# Patient Record
Sex: Male | Born: 1985 | Race: Black or African American | Hispanic: No | Marital: Single | Smoking: Never smoker
Health system: Southern US, Community
[De-identification: ages and names within clinical notes are randomized; demographics above are authoritative.]

## PROBLEM LIST (undated history)

## (undated) DIAGNOSIS — S42009A Fracture of unspecified part of unspecified clavicle, initial encounter for closed fracture: Secondary | ICD-10-CM

---
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
## 2017-06-02 ENCOUNTER — Encounter (HOSPITAL_COMMUNITY): Payer: Self-pay | Admitting: Nurse Practitioner

## 2017-06-02 ENCOUNTER — Other Ambulatory Visit: Payer: Self-pay

## 2017-06-02 ENCOUNTER — Emergency Department (HOSPITAL_COMMUNITY)
Admission: EM | Admit: 2017-06-02 | Discharge: 2017-06-02 | Disposition: A | Discharge status: Home / Self Care | Payer: BLUE CROSS/BLUE SHIELD | Attending: Emergency Medicine | Admitting: Emergency Medicine

## 2017-06-02 ENCOUNTER — Emergency Department (HOSPITAL_COMMUNITY): Payer: BLUE CROSS/BLUE SHIELD

## 2017-06-02 DIAGNOSIS — R600 Localized edema: Secondary | ICD-10-CM | POA: Insufficient documentation

## 2017-06-02 DIAGNOSIS — L03115 Cellulitis of right lower limb: Secondary | ICD-10-CM | POA: Insufficient documentation

## 2017-06-02 DIAGNOSIS — M25561 Pain in right knee: Secondary | ICD-10-CM | POA: Diagnosis present

## 2017-06-02 LAB — CBC WITH DIFFERENTIAL/PLATELET
Basophils Absolute: 0 10*3/uL (ref 0.0–0.1)
Basophils Relative: 0 %
EOS ABS: 0.2 10*3/uL (ref 0.0–0.7)
EOS PCT: 2 %
HCT: 42.8 % (ref 39.0–52.0)
HEMOGLOBIN: 15.1 g/dL (ref 13.0–17.0)
LYMPHS ABS: 3.1 10*3/uL (ref 0.7–4.0)
LYMPHS PCT: 26 %
MCH: 29.4 pg (ref 26.0–34.0)
MCHC: 35.3 g/dL (ref 30.0–36.0)
MCV: 83.4 fL (ref 78.0–100.0)
MONOS PCT: 11 %
Monocytes Absolute: 1.3 10*3/uL — ABNORMAL HIGH (ref 0.1–1.0)
Neutro Abs: 7.1 10*3/uL (ref 1.7–7.7)
Neutrophils Relative %: 61 %
PLATELETS: 171 10*3/uL (ref 150–400)
RBC: 5.13 MIL/uL (ref 4.22–5.81)
RDW: 12.2 % (ref 11.5–15.5)
WBC: 11.7 10*3/uL — AB (ref 4.0–10.5)

## 2017-06-02 LAB — COMPREHENSIVE METABOLIC PANEL
ALBUMIN: 4.5 g/dL (ref 3.5–5.0)
ALT: 64 U/L — AB (ref 17–63)
AST: 52 U/L — AB (ref 15–41)
Alkaline Phosphatase: 73 U/L (ref 38–126)
Anion gap: 8 (ref 5–15)
BUN: 11 mg/dL (ref 6–20)
CHLORIDE: 103 mmol/L (ref 101–111)
CO2: 28 mmol/L (ref 22–32)
CREATININE: 1.07 mg/dL (ref 0.61–1.24)
Calcium: 9.3 mg/dL (ref 8.9–10.3)
GFR calc Af Amer: >60 mL/min (ref >60)
GLUCOSE: 95 mg/dL (ref 65–99)
POTASSIUM: 4 mmol/L (ref 3.5–5.1)
SODIUM: 139 mmol/L (ref 135–145)
Total Bilirubin: 1.7 mg/dL — ABNORMAL HIGH (ref 0.3–1.2)
Total Protein: 8.1 g/dL (ref 6.5–8.1)

## 2017-06-02 LAB — I-STAT CG4 LACTIC ACID, ED: LACTIC ACID, VENOUS: 0.99 mmol/L (ref 0.5–1.9)

## 2017-06-02 MED ORDER — CLINDAMYCIN HCL 300 MG PO CAPS: 300.0000 mg | ORAL_CAPSULE | Freq: Three times a day (TID) | ORAL | 0 refills | Status: AC

## 2017-06-02 MED ORDER — OXYCODONE-ACETAMINOPHEN 5-325 MG PO TABS
1.0000 | ORAL_TABLET | Freq: Once | ORAL | Status: AC
  Administered 2017-06-02: 1 via ORAL
  Filled 2017-06-02: qty 1

## 2017-06-02 MED ORDER — CLINDAMYCIN HCL 300 MG PO CAPS
300.0000 mg | ORAL_CAPSULE | Freq: Once | ORAL | Status: AC
  Administered 2017-06-02: 300 mg via ORAL
  Filled 2017-06-02: qty 1

## 2017-06-02 MED ORDER — DICLOFENAC SODIUM 50 MG PO TBEC: 50.0000 mg | DELAYED_RELEASE_TABLET | Freq: Two times a day (BID) | ORAL | 0 refills | Status: AC

## 2017-06-02 NOTE — Discharge Instructions (Signed)
Take the medication as directed. Follow up with your doctor on Monday or return here for worsening symptoms.

## 2017-06-02 NOTE — ED Provider Notes (Signed)
[facility]-EMERGENCY DEPT Provider Note   CSN: [id number] Arrival date & time: 06/02/17  1503 [phone]    History   Chief Complaint Chief Complaint  Patient presents with  . Joint Swelling    R knee    HPI Allen Morrison is a 31 y.o. male who presents to the ED with right knee pain. Patient reports that 3 days ago he had a small pimple like area on his knee and he popped it. After that his knee began to swell and get red and feel hot. Patient went to Urgent Care and was sent here for evaluation for possible septic joint.   HPI  History reviewed. No pertinent past medical history.  There are no active problems to display for this patient.   History reviewed. No pertinent surgical history.     Home Medications    Prior to Admission medications   Medication Sig Start Date End Date Taking? Authorizing Provider  clindamycin (CLEOCIN) 300 MG capsule Take 1 capsule (300 mg total) by mouth 3 (three) times daily. 06/02/17   Janne NapoleonNeese, Laniqua Torrens M, NP  diclofenac (VOLTAREN) 50 MG EC tablet Take 1 tablet (50 mg total) by mouth 2 (two) times daily. 06/02/17   Janne NapoleonNeese, Lanique Gonzalo M, NP    Family History History reviewed. No pertinent family history.  Social History Social History   Tobacco Use  . Smoking status: Not on file  Substance Use Topics  . Alcohol use: Not on file  . Drug use: Not on file     Allergies   Patient has no known allergies.   Review of Systems Review of Systems  Constitutional: Positive for chills. Fever: ?  HENT: Positive for congestion and sinus pressure.   Respiratory: Negative for shortness of breath.   Cardiovascular: Leg swelling: right knee.  Gastrointestinal: Negative for nausea and vomiting.  Musculoskeletal: Positive for arthralgias.  Skin: Positive for wound.  Allergic/Immunologic: Negative for immunocompromised state.  Psychiatric/Behavioral: Negative for confusion.     Physical Exam Updated Vital Signs BP 137/90 (BP Location:  Right Arm)   Pulse 81   Temp 97.6 F (36.4 C) (Oral)   Resp 14   SpO2 99%   Physical Exam  Constitutional: He is oriented to person, place, and time. He appears well-developed and well-nourished. No distress.  HENT:  Head: Normocephalic.  Eyes: EOM are normal.  Neck: Neck supple.  Cardiovascular: Normal rate.  Pulmonary/Chest: Effort normal.  Musculoskeletal: Normal range of motion.       Right knee: He exhibits swelling and erythema. He exhibits normal range of motion, no effusion and no ecchymosis. Tenderness found.       Legs: Right knee with full range of motion with mild pain. There is swelling and erythema noted to the anterior aspect of the right knee. There is a small wound noted. No gross knee effusion. Pedal pulse 2+, adequate circulation.   Neurological: He is alert and oriented to person, place, and time. No cranial nerve deficit.  Skin: There is erythema.  Psychiatric: He has a normal mood and affect. His behavior is normal.  Nursing note and vitals reviewed.    ED Treatments / Results  Labs (all labs ordered are listed, but only abnormal results are displayed) Labs Reviewed  COMPREHENSIVE METABOLIC PANEL - Abnormal; Notable for the following components:      Result Value   AST 52 (*)    ALT 64 (*)    Total Bilirubin 1.7 (*)    All other components  within normal limits  CBC WITH DIFFERENTIAL/PLATELET - Abnormal; Notable for the following components:   WBC 11.7 (*)    Monocytes Absolute 1.3 (*)    All other components within normal limits  I-STAT CG4 LACTIC ACID, ED     Radiology Dg Knee Complete 4 Views Right  Result Date: 06/02/2017 CLINICAL DATA:  Right knee redness and swelling. EXAM: RIGHT KNEE - COMPLETE 4+ VIEW COMPARISON:  None. FINDINGS: No evidence of fracture, dislocation, or joint effusion. No evidence of arthropathy or other focal bone abnormality. Prominent prepatellar and infrapatellar soft tissue swelling. IMPRESSION: Prominent prepatellar  and infrapatellar soft tissue swelling. No acute osseous abnormality or knee joint effusion. Electronically Signed   By: Obie DredgeWilliam T Derry M.D.   On: 06/02/2017 16:24    Procedures Procedures (including critical care time)  Medications Ordered in ED Medications  oxyCODONE-acetaminophen (PERCOCET/ROXICET) 5-325 MG per tablet 1 tablet (1 tablet Oral Given 06/02/17 1933)  clindamycin (CLEOCIN) capsule 300 mg (300 mg Oral Given 06/02/17 1933)   Dr. Rush Landmarkegeler in to evaluate the patient and agrees with assess and plan.  Initial Impression / Assessment and Plan / ED Course  I have reviewed the triage vital signs and the nursing notes. 31 y.o. male with right knee pain and swelling stable for d/c without red streaking, fever or difficulty ambulation. Discussed in detail with the patient plan of care including antibiotics and close follow up. Return precautions discussed. Patient agrees with plan.  Final Clinical Impressions(s) / ED Diagnoses   Final diagnoses:  Cellulitis of right knee    ED Discharge Orders        Ordered    clindamycin (CLEOCIN) 300 MG capsule  3 times daily     06/02/17 1920    diclofenac (VOLTAREN) 50 MG EC tablet  2 times daily     06/02/17 [address]       Daiel Strohecker ArnoldM, TexasNP 06/02/17 1941    Tegeler, Canary Brimhristopher J, MD 06/02/17 [phone]

## 2017-06-02 NOTE — ED Triage Notes (Addendum)
Pt reports R knee swelling x3 days. Denies fevers, but R knee is hot to touch. He denies any injury or insect bites. He reports that he had a bump that he thought was a pimple so he tried to pop it and then next day it was sore and starting swelling. Sent from Urgent Care with concern for a septic joint.

## 2018-12-10 ENCOUNTER — Encounter (HOSPITAL_COMMUNITY): Payer: Self-pay | Admitting: Pharmacy Technician

## 2018-12-10 ENCOUNTER — Emergency Department (HOSPITAL_COMMUNITY): Payer: BC Managed Care – PPO

## 2018-12-10 ENCOUNTER — Other Ambulatory Visit: Payer: Self-pay

## 2018-12-10 ENCOUNTER — Inpatient Hospital Stay (HOSPITAL_COMMUNITY)
Admission: EM | Admit: 2018-12-10 | Discharge: 2018-12-14 | DRG: 957 | Disposition: A | Discharge status: Home Health | Payer: BC Managed Care – PPO | Attending: Physician Assistant | Admitting: Physician Assistant

## 2018-12-10 DIAGNOSIS — S06339A Contusion and laceration of cerebrum, unspecified, with loss of consciousness of unspecified duration, initial encounter: Secondary | ICD-10-CM | POA: Diagnosis present

## 2018-12-10 DIAGNOSIS — S42021A Displaced fracture of shaft of right clavicle, initial encounter for closed fracture: Secondary | ICD-10-CM

## 2018-12-10 DIAGNOSIS — Z23 Encounter for immunization: Secondary | ICD-10-CM

## 2018-12-10 DIAGNOSIS — T1490XA Injury, unspecified, initial encounter: Secondary | ICD-10-CM | POA: Diagnosis not present

## 2018-12-10 DIAGNOSIS — S02119A Unspecified fracture of occiput, initial encounter for closed fracture: Secondary | ICD-10-CM

## 2018-12-10 DIAGNOSIS — S27329A Contusion of lung, unspecified, initial encounter: Secondary | ICD-10-CM

## 2018-12-10 DIAGNOSIS — Z885 Allergy status to narcotic agent status: Secondary | ICD-10-CM

## 2018-12-10 DIAGNOSIS — J939 Pneumothorax, unspecified: Secondary | ICD-10-CM

## 2018-12-10 DIAGNOSIS — S2241XA Multiple fractures of ribs, right side, initial encounter for closed fracture: Secondary | ICD-10-CM

## 2018-12-10 DIAGNOSIS — T148XXA Other injury of unspecified body region, initial encounter: Secondary | ICD-10-CM

## 2018-12-10 DIAGNOSIS — S27321A Contusion of lung, unilateral, initial encounter: Secondary | ICD-10-CM

## 2018-12-10 DIAGNOSIS — S42111A Displaced fracture of body of scapula, right shoulder, initial encounter for closed fracture: Secondary | ICD-10-CM | POA: Diagnosis present

## 2018-12-10 DIAGNOSIS — S42001A Fracture of unspecified part of right clavicle, initial encounter for closed fracture: Principal | ICD-10-CM

## 2018-12-10 DIAGNOSIS — D62 Acute posthemorrhagic anemia: Secondary | ICD-10-CM | POA: Diagnosis present

## 2018-12-10 DIAGNOSIS — N179 Acute kidney failure, unspecified: Secondary | ICD-10-CM | POA: Diagnosis present

## 2018-12-10 DIAGNOSIS — Z419 Encounter for procedure for purposes other than remedying health state, unspecified: Secondary | ICD-10-CM

## 2018-12-10 DIAGNOSIS — Z1159 Encounter for screening for other viral diseases: Secondary | ICD-10-CM

## 2018-12-10 DIAGNOSIS — K661 Hemoperitoneum: Secondary | ICD-10-CM | POA: Diagnosis present

## 2018-12-10 DIAGNOSIS — S42191A Fracture of other part of scapula, right shoulder, initial encounter for closed fracture: Secondary | ICD-10-CM

## 2018-12-10 DIAGNOSIS — S2249XA Multiple fractures of ribs, unspecified side, initial encounter for closed fracture: Secondary | ICD-10-CM

## 2018-12-10 DIAGNOSIS — S0101XA Laceration without foreign body of scalp, initial encounter: Secondary | ICD-10-CM

## 2018-12-10 DIAGNOSIS — S272XXA Traumatic hemopneumothorax, initial encounter: Secondary | ICD-10-CM

## 2018-12-10 DIAGNOSIS — S42009A Fracture of unspecified part of unspecified clavicle, initial encounter for closed fracture: Secondary | ICD-10-CM

## 2018-12-10 DIAGNOSIS — R000 Tachycardia, unspecified: Secondary | ICD-10-CM | POA: Diagnosis present

## 2018-12-10 HISTORY — DX: Fracture of unspecified part of unspecified clavicle, initial encounter for closed fracture: S42.009A

## 2018-12-10 LAB — COMPREHENSIVE METABOLIC PANEL
ALT: 29 U/L (ref 0–44)
AST: 41 U/L (ref 15–41)
Albumin: 4.1 g/dL (ref 3.5–5.0)
Alkaline Phosphatase: 55 U/L (ref 38–126)
Anion gap: 13 (ref 5–15)
BUN: 12 mg/dL (ref 6–20)
CO2: 21 mmol/L — ABNORMAL LOW (ref 22–32)
Calcium: 9.1 mg/dL (ref 8.9–10.3)
Chloride: 105 mmol/L (ref 98–111)
Creatinine, Ser: 1.29 mg/dL — ABNORMAL HIGH (ref 0.61–1.24)
GFR calc Af Amer: >60 mL/min (ref >60)
GFR calc non Af Amer: >60 mL/min (ref >60)
Glucose, Bld: 144 mg/dL — ABNORMAL HIGH (ref 70–99)
Potassium: 3.4 mmol/L — ABNORMAL LOW (ref 3.5–5.1)
Sodium: 139 mmol/L (ref 135–145)
Total Bilirubin: 1.4 mg/dL — ABNORMAL HIGH (ref 0.3–1.2)
Total Protein: 6.9 g/dL (ref 6.5–8.1)

## 2018-12-10 LAB — CBC
HCT: 45.9 % (ref 39.0–52.0)
Hemoglobin: 15.9 g/dL (ref 13.0–17.0)
MCH: 29.2 pg (ref 26.0–34.0)
MCHC: 34.6 g/dL (ref 30.0–36.0)
MCV: 84.2 fL (ref 80.0–100.0)
Platelets: 184 10*3/uL (ref 150–400)
RBC: 5.45 MIL/uL (ref 4.22–5.81)
RDW: 12 % (ref 11.5–15.5)
WBC: 13.5 10*3/uL — ABNORMAL HIGH (ref 4.0–10.5)
nRBC: 0 % (ref 0.0–0.2)

## 2018-12-10 LAB — PROTIME-INR
INR: 1.1 (ref 0.8–1.2)
Prothrombin Time: 13.8 seconds (ref 11.4–15.2)

## 2018-12-10 LAB — I-STAT CHEM 8, ED
BUN: 14 mg/dL (ref 6–20)
Calcium, Ion: 1.06 mmol/L — ABNORMAL LOW (ref 1.15–1.40)
Chloride: 107 mmol/L (ref 98–111)
Creatinine, Ser: 1.4 mg/dL — ABNORMAL HIGH (ref 0.61–1.24)
Glucose, Bld: 142 mg/dL — ABNORMAL HIGH (ref 70–99)
HCT: 46 % (ref 39.0–52.0)
Hemoglobin: 15.6 g/dL (ref 13.0–17.0)
Potassium: 3.2 mmol/L — ABNORMAL LOW (ref 3.5–5.1)
Sodium: 140 mmol/L (ref 135–145)
TCO2: 24 mmol/L (ref 22–32)

## 2018-12-10 LAB — CDS SEROLOGY

## 2018-12-10 LAB — ETHANOL: Alcohol, Ethyl (B): <10 mg/dL (ref <10)

## 2018-12-10 LAB — LACTIC ACID, PLASMA: Lactic Acid, Venous: 1.9 mmol/L (ref 0.5–1.9)

## 2018-12-10 LAB — SAMPLE TO BLOOD BANK

## 2018-12-10 MED ORDER — TETANUS-DIPHTH-ACELL PERTUSSIS 5-2.5-18.5 LF-MCG/0.5 IM SUSP
0.5000 mL | Freq: Once | INTRAMUSCULAR | Status: AC
  Administered 2018-12-10: 0.5 mL via INTRAMUSCULAR
  Filled 2018-12-10: qty 0.5

## 2018-12-10 MED ORDER — SODIUM CHLORIDE 0.9 % IV BOLUS
500.0000 mL | Freq: Once | INTRAVENOUS | Status: AC
  Administered 2018-12-10: 500 mL via INTRAVENOUS

## 2018-12-10 MED ORDER — IOHEXOL 300 MG/ML  SOLN
100.0000 mL | Freq: Once | INTRAMUSCULAR | Status: AC | PRN
  Administered 2018-12-10: 100 mL via INTRAVENOUS

## 2018-12-10 MED ORDER — FENTANYL CITRATE (PF) 100 MCG/2ML IJ SOLN
50.0000 ug | Freq: Once | INTRAMUSCULAR | Status: AC
  Administered 2018-12-10: 50 ug via INTRAVENOUS
  Filled 2018-12-10: qty 2
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
## 2018-12-10 NOTE — ED Notes (Signed)
Pt transported to CT ?

## 2018-12-11 ENCOUNTER — Emergency Department (HOSPITAL_COMMUNITY): Payer: BC Managed Care – PPO

## 2018-12-11 ENCOUNTER — Encounter (HOSPITAL_COMMUNITY): Payer: Self-pay

## 2018-12-11 ENCOUNTER — Inpatient Hospital Stay (HOSPITAL_COMMUNITY): Payer: BC Managed Care – PPO | Admitting: Anesthesiology

## 2018-12-11 ENCOUNTER — Inpatient Hospital Stay (HOSPITAL_COMMUNITY): Payer: BC Managed Care – PPO

## 2018-12-11 ENCOUNTER — Encounter (HOSPITAL_COMMUNITY): Admission: EM | Disposition: A | Discharge status: Home Health | Payer: Self-pay | Source: Home / Self Care

## 2018-12-11 DIAGNOSIS — S06339A Contusion and laceration of cerebrum, unspecified, with loss of consciousness of unspecified duration, initial encounter: Secondary | ICD-10-CM | POA: Diagnosis present

## 2018-12-11 DIAGNOSIS — N179 Acute kidney failure, unspecified: Secondary | ICD-10-CM | POA: Diagnosis present

## 2018-12-11 DIAGNOSIS — Z23 Encounter for immunization: Secondary | ICD-10-CM | POA: Diagnosis not present

## 2018-12-11 DIAGNOSIS — S42001A Fracture of unspecified part of right clavicle, initial encounter for closed fracture: Secondary | ICD-10-CM | POA: Diagnosis present

## 2018-12-11 DIAGNOSIS — D62 Acute posthemorrhagic anemia: Secondary | ICD-10-CM | POA: Diagnosis present

## 2018-12-11 DIAGNOSIS — S0101XA Laceration without foreign body of scalp, initial encounter: Secondary | ICD-10-CM | POA: Diagnosis present

## 2018-12-11 DIAGNOSIS — S42111A Displaced fracture of body of scapula, right shoulder, initial encounter for closed fracture: Secondary | ICD-10-CM | POA: Diagnosis present

## 2018-12-11 DIAGNOSIS — Z885 Allergy status to narcotic agent status: Secondary | ICD-10-CM | POA: Diagnosis not present

## 2018-12-11 DIAGNOSIS — T1490XA Injury, unspecified, initial encounter: Secondary | ICD-10-CM | POA: Diagnosis present

## 2018-12-11 DIAGNOSIS — Z1159 Encounter for screening for other viral diseases: Secondary | ICD-10-CM | POA: Diagnosis not present

## 2018-12-11 DIAGNOSIS — K661 Hemoperitoneum: Secondary | ICD-10-CM | POA: Diagnosis present

## 2018-12-11 DIAGNOSIS — R000 Tachycardia, unspecified: Secondary | ICD-10-CM | POA: Diagnosis present

## 2018-12-11 DIAGNOSIS — S272XXA Traumatic hemopneumothorax, initial encounter: Secondary | ICD-10-CM | POA: Diagnosis present

## 2018-12-11 DIAGNOSIS — S2241XA Multiple fractures of ribs, right side, initial encounter for closed fracture: Secondary | ICD-10-CM | POA: Diagnosis present

## 2018-12-11 HISTORY — PX: ORIF CLAVICLE FRACTURE: SUR924

## 2018-12-11 HISTORY — PX: ORIF CLAVICULAR FRACTURE: SHX5055

## 2018-12-11 LAB — CBC
HCT: 42.3 % (ref 39.0–52.0)
Hemoglobin: 14.5 g/dL (ref 13.0–17.0)
MCH: 28.7 pg (ref 26.0–34.0)
MCHC: 34.3 g/dL (ref 30.0–36.0)
MCV: 83.8 fL (ref 80.0–100.0)
Platelets: 152 10*3/uL (ref 150–400)
RBC: 5.05 MIL/uL (ref 4.22–5.81)
RDW: 12 % (ref 11.5–15.5)
WBC: 14.3 10*3/uL — ABNORMAL HIGH (ref 4.0–10.5)
nRBC: 0 % (ref 0.0–0.2)

## 2018-12-11 LAB — BASIC METABOLIC PANEL
Anion gap: 10 (ref 5–15)
BUN: 11 mg/dL (ref 6–20)
CO2: 23 mmol/L (ref 22–32)
Calcium: 8.7 mg/dL — ABNORMAL LOW (ref 8.9–10.3)
Chloride: 106 mmol/L (ref 98–111)
Creatinine, Ser: 1.17 mg/dL (ref 0.61–1.24)
GFR calc Af Amer: >60 mL/min (ref >60)
GFR calc non Af Amer: >60 mL/min (ref >60)
Glucose, Bld: 148 mg/dL — ABNORMAL HIGH (ref 70–99)
Potassium: 3.5 mmol/L (ref 3.5–5.1)
Sodium: 139 mmol/L (ref 135–145)

## 2018-12-11 LAB — MRSA PCR SCREENING: MRSA by PCR: NEGATIVE

## 2018-12-11 SURGERY — OPEN REDUCTION INTERNAL FIXATION (ORIF) CLAVICULAR FRACTURE
Anesthesia: General | Laterality: Right

## 2018-12-11 MED ORDER — LACTATED RINGERS IV SOLN
INTRAVENOUS | Status: DC
  Administered 2018-12-11 (×2): via INTRAVENOUS

## 2018-12-11 MED ORDER — FENTANYL CITRATE (PF) 250 MCG/5ML IJ SOLN
INTRAMUSCULAR | Status: DC | PRN
  Administered 2018-12-11: 50 ug via INTRAVENOUS
  Administered 2018-12-11: 100 ug via INTRAVENOUS
  Administered 2018-12-11 (×3): 50 ug via INTRAVENOUS

## 2018-12-11 MED ORDER — CEFAZOLIN SODIUM-DEXTROSE 2-3 GM-%(50ML) IV SOLR
INTRAVENOUS | Status: DC | PRN
  Administered 2018-12-11: 2 g via INTRAVENOUS

## 2018-12-11 MED ORDER — ACETAMINOPHEN 500 MG PO TABS
1000.0000 mg | ORAL_TABLET | Freq: Four times a day (QID) | ORAL | Status: DC
  Administered 2018-12-11 – 2018-12-14 (×10): 1000 mg via ORAL
  Filled 2018-12-11 (×10): qty 2

## 2018-12-11 MED ORDER — HYDROMORPHONE HCL 1 MG/ML IJ SOLN: 1.0000 mg | INTRAMUSCULAR | Status: DC | PRN

## 2018-12-11 MED ORDER — FENTANYL CITRATE (PF) 250 MCG/5ML IJ SOLN
INTRAMUSCULAR | Status: AC
  Filled 2018-12-11: qty 5

## 2018-12-11 MED ORDER — ROCURONIUM BROMIDE 10 MG/ML (PF) SYRINGE
PREFILLED_SYRINGE | INTRAVENOUS | Status: DC | PRN
  Administered 2018-12-11: 10 mg via INTRAVENOUS
  Administered 2018-12-11: 70 mg via INTRAVENOUS
  Administered 2018-12-11: 20 mg via INTRAVENOUS

## 2018-12-11 MED ORDER — HYDROMORPHONE HCL 1 MG/ML IJ SOLN
1.0000 mg | INTRAMUSCULAR | Status: DC | PRN
  Administered 2018-12-11 (×2): 1 mg via INTRAVENOUS
  Filled 2018-12-11 (×2): qty 1

## 2018-12-11 MED ORDER — MIDAZOLAM HCL 2 MG/2ML IJ SOLN
INTRAMUSCULAR | Status: DC | PRN
  Administered 2018-12-11 (×2): 1 mg via INTRAVENOUS

## 2018-12-11 MED ORDER — KETOROLAC TROMETHAMINE 15 MG/ML IJ SOLN
15.0000 mg | Freq: Three times a day (TID) | INTRAMUSCULAR | Status: DC | PRN
  Administered 2018-12-11 – 2018-12-12 (×2): 15 mg via INTRAVENOUS
  Filled 2018-12-11 (×2): qty 1

## 2018-12-11 MED ORDER — BISACODYL 10 MG RE SUPP: 10.0000 mg | Freq: Every day | RECTAL | Status: DC | PRN

## 2018-12-11 MED ORDER — VANCOMYCIN HCL 1000 MG IV SOLR
INTRAVENOUS | Status: AC | PRN
  Administered 2018-12-11: 1000 mg via INTRAVENOUS

## 2018-12-11 MED ORDER — OXYCODONE HCL 5 MG PO TABS: 10.0000 mg | ORAL_TABLET | ORAL | Status: DC | PRN

## 2018-12-11 MED ORDER — MIDAZOLAM HCL 2 MG/2ML IJ SOLN
INTRAMUSCULAR | Status: AC
  Filled 2018-12-11: qty 2

## 2018-12-11 MED ORDER — METHOCARBAMOL 750 MG PO TABS
750.0000 mg | ORAL_TABLET | Freq: Three times a day (TID) | ORAL | Status: DC
  Administered 2018-12-11 – 2018-12-14 (×10): 750 mg via ORAL
  Filled 2018-12-11 (×10): qty 1

## 2018-12-11 MED ORDER — OXYCODONE HCL 5 MG PO TABS: 5.0000 mg | ORAL_TABLET | ORAL | Status: DC | PRN

## 2018-12-11 MED ORDER — DOCUSATE SODIUM 100 MG PO CAPS
100.0000 mg | ORAL_CAPSULE | Freq: Two times a day (BID) | ORAL | Status: DC
  Administered 2018-12-11 – 2018-12-14 (×7): 100 mg via ORAL
  Filled 2018-12-11 (×7): qty 1

## 2018-12-11 MED ORDER — OXYCODONE HCL 5 MG PO TABS
5.0000 mg | ORAL_TABLET | ORAL | Status: DC | PRN
  Administered 2018-12-11 – 2018-12-12 (×3): 10 mg via ORAL
  Administered 2018-12-12: 5 mg via ORAL
  Administered 2018-12-13 – 2018-12-14 (×3): 10 mg via ORAL
  Filled 2018-12-11 (×7): qty 2

## 2018-12-11 MED ORDER — SUGAMMADEX SODIUM 200 MG/2ML IV SOLN
INTRAVENOUS | Status: DC | PRN
  Administered 2018-12-11: 200 mg via INTRAVENOUS

## 2018-12-11 MED ORDER — 0.9 % SODIUM CHLORIDE (POUR BTL) OPTIME
TOPICAL | Status: DC | PRN
  Administered 2018-12-11: 1000 mL

## 2018-12-11 MED ORDER — VANCOMYCIN HCL 1000 MG IV SOLR
INTRAVENOUS | Status: AC
  Filled 2018-12-11: qty 1000

## 2018-12-11 MED ORDER — GABAPENTIN 100 MG PO CAPS
100.0000 mg | ORAL_CAPSULE | Freq: Three times a day (TID) | ORAL | Status: DC
  Administered 2018-12-11 – 2018-12-14 (×9): 100 mg via ORAL
  Filled 2018-12-11 (×9): qty 1

## 2018-12-11 MED ORDER — PROPOFOL 10 MG/ML IV BOLUS
INTRAVENOUS | Status: AC
  Filled 2018-12-11: qty 20

## 2018-12-11 MED ORDER — PHENYLEPHRINE HCL (PRESSORS) 10 MG/ML IV SOLN
INTRAVENOUS | Status: DC | PRN
  Administered 2018-12-11: 40 ug via INTRAVENOUS

## 2018-12-11 MED ORDER — SODIUM CHLORIDE 0.9 % IV SOLN
INTRAVENOUS | Status: DC
  Administered 2018-12-11 – 2018-12-13 (×5): via INTRAVENOUS

## 2018-12-11 MED ORDER — PROPOFOL 10 MG/ML IV BOLUS
INTRAVENOUS | Status: DC | PRN
  Administered 2018-12-11: 200 mg via INTRAVENOUS

## 2018-12-11 MED ORDER — ONDANSETRON 4 MG PO TBDP: 4.0000 mg | ORAL_TABLET | Freq: Four times a day (QID) | ORAL | Status: DC | PRN

## 2018-12-11 MED ORDER — CEFAZOLIN SODIUM-DEXTROSE 2-4 GM/100ML-% IV SOLN
2.0000 g | Freq: Three times a day (TID) | INTRAVENOUS | Status: AC
  Administered 2018-12-11 – 2018-12-12 (×3): 2 g via INTRAVENOUS
  Filled 2018-12-11 (×3): qty 100

## 2018-12-11 MED ORDER — FENTANYL CITRATE (PF) 100 MCG/2ML IJ SOLN: 25.0000 ug | INTRAMUSCULAR | Status: DC | PRN

## 2018-12-11 MED ORDER — ONDANSETRON HCL 4 MG/2ML IJ SOLN
INTRAMUSCULAR | Status: DC | PRN
  Administered 2018-12-11: 4 mg via INTRAVENOUS

## 2018-12-11 MED ORDER — LIDOCAINE 2% (20 MG/ML) 5 ML SYRINGE
INTRAMUSCULAR | Status: DC | PRN
  Administered 2018-12-11: 80 mg via INTRAVENOUS

## 2018-12-11 MED ORDER — IOHEXOL 350 MG/ML SOLN
75.0000 mL | Freq: Once | INTRAVENOUS | Status: AC | PRN
  Administered 2018-12-11: 75 mL via INTRAVENOUS

## 2018-12-11 MED ORDER — DEXAMETHASONE SODIUM PHOSPHATE 10 MG/ML IJ SOLN
INTRAMUSCULAR | Status: DC | PRN
  Administered 2018-12-11: 10 mg via INTRAVENOUS

## 2018-12-11 MED ORDER — ONDANSETRON HCL 4 MG/2ML IJ SOLN
4.0000 mg | Freq: Four times a day (QID) | INTRAMUSCULAR | Status: DC | PRN
  Administered 2018-12-11: 4 mg via INTRAVENOUS
  Filled 2018-12-11: qty 2

## 2018-12-11 SURGICAL SUPPLY — 61 items
ADH SKN CLS APL DERMABOND .7 (GAUZE/BANDAGES/DRESSINGS) ×1
BIT DRILL 2.5X110 QC LCP DISP (BIT) ×2 IMPLANT
BIT DRILL LCP QC 2X140 (BIT) ×2 IMPLANT
BIT DRILL QC 2.7MM (BIT) IMPLANT
BNDG COHESIVE 4X5 TAN STRL (GAUZE/BANDAGES/DRESSINGS) IMPLANT
BRUSH SCRUB SURG 4.25 DISP (MISCELLANEOUS) ×6 IMPLANT
CHLORAPREP W/TINT 26ML (MISCELLANEOUS) ×3 IMPLANT
COVER SURGICAL LIGHT HANDLE (MISCELLANEOUS) ×6 IMPLANT
COVER WAND RF STERILE (DRAPES) ×3 IMPLANT
DERMABOND ADVANCED (GAUZE/BANDAGES/DRESSINGS) ×2
DERMABOND ADVANCED .7 DNX12 (GAUZE/BANDAGES/DRESSINGS) ×2 IMPLANT
DRAPE C-ARM 42X72 X-RAY (DRAPES) ×3 IMPLANT
DRAPE INCISE IOBAN 66X45 STRL (DRAPES) ×3 IMPLANT
DRAPE ORTHO SPLIT 77X108 STRL (DRAPES) ×6
DRAPE SURG ORHT 6 SPLT 77X108 (DRAPES) ×2 IMPLANT
DRAPE U-SHAPE 47X51 STRL (DRAPES) ×6 IMPLANT
DRSG MEPILEX BORDER 4X8 (GAUZE/BANDAGES/DRESSINGS) ×3 IMPLANT
ELECT REM PT RETURN 9FT ADLT (ELECTROSURGICAL) ×3
ELECTRODE REM PT RTRN 9FT ADLT (ELECTROSURGICAL) ×1 IMPLANT
GLOVE BIO SURGEON STRL SZ 6.5 (GLOVE) ×6 IMPLANT
GLOVE BIO SURGEON STRL SZ7.5 (GLOVE) ×9 IMPLANT
GLOVE BIO SURGEONS STRL SZ 6.5 (GLOVE) ×3
GLOVE BIOGEL PI IND STRL 6.5 (GLOVE) ×1 IMPLANT
GLOVE BIOGEL PI IND STRL 7.5 (GLOVE) ×1 IMPLANT
GLOVE BIOGEL PI INDICATOR 6.5 (GLOVE) ×2
GLOVE BIOGEL PI INDICATOR 7.5 (GLOVE) ×2
GOWN STRL REUS W/ TWL LRG LVL3 (GOWN DISPOSABLE) ×2 IMPLANT
GOWN STRL REUS W/TWL LRG LVL3 (GOWN DISPOSABLE) ×6
KIT BASIN OR (CUSTOM PROCEDURE TRAY) ×3 IMPLANT
KIT TURNOVER KIT B (KITS) ×3 IMPLANT
MANIFOLD NEPTUNE II (INSTRUMENTS) ×3 IMPLANT
NDL HYPO 25GX1X1/2 BEV (NEEDLE) IMPLANT
NEEDLE HYPO 25GX1X1/2 BEV (NEEDLE) IMPLANT
NS IRRIG 1000ML POUR BTL (IV SOLUTION) ×3 IMPLANT
PACK GENERAL/GYN (CUSTOM PROCEDURE TRAY) ×3 IMPLANT
PAD ARMBOARD 7.5X6 YLW CONV (MISCELLANEOUS) ×6 IMPLANT
PROS DRILL BIT QC 2.7MM (BIT) ×3
PROS LCP PLATE 9H 124M (Plate) ×3 IMPLANT
PROSTHESIS LCP PLATE 9H 124M (Plate) IMPLANT
SCREW BN 2.5XFT 20X2.7X5X CORT (Screw) IMPLANT
SCREW CORT ST 2.7X20 (Screw) ×3 IMPLANT
SCREW CORTEX 2.7X22MM (Screw) ×3 IMPLANT
SCREW CORTEX 3.5 18MM (Screw) ×8 IMPLANT
SCREW CORTEX 3.5 20MM (Screw) ×6 IMPLANT
SCREW LOCK CORT ST 3.5X18 (Screw) IMPLANT
SCREW LOCK CORT ST 3.5X20 (Screw) IMPLANT
SLING ARM IMMOBILIZER LRG (SOFTGOODS) ×3 IMPLANT
SLING ARM IMMOBILIZER MED (SOFTGOODS) IMPLANT
STAPLER VISISTAT 35W (STAPLE) ×3 IMPLANT
STOCKINETTE IMPERVIOUS 9X36 MD (GAUZE/BANDAGES/DRESSINGS) IMPLANT
SUCTION FRAZIER HANDLE 10FR (MISCELLANEOUS) ×2
SUCTION TUBE FRAZIER 10FR DISP (MISCELLANEOUS) ×1 IMPLANT
SUT MNCRL AB 3-0 PS2 18 (SUTURE) ×3 IMPLANT
SUT MNCRL AB 3-0 PS2 27 (SUTURE) ×3 IMPLANT
SUT VIC AB 0 CT1 27 (SUTURE) ×3
SUT VIC AB 0 CT1 27XBRD ANBCTR (SUTURE) ×1 IMPLANT
SUT VIC AB 2-0 CT1 27 (SUTURE) ×3
SUT VIC AB 2-0 CT1 TAPERPNT 27 (SUTURE) ×1 IMPLANT
SYR CONTROL 10ML LL (SYRINGE) IMPLANT
TOWEL OR 17X26 10 PK STRL BLUE (TOWEL DISPOSABLE) ×3 IMPLANT
WATER STERILE IRR 1000ML POUR (IV SOLUTION) ×3 IMPLANT
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
## 2018-12-11 NOTE — Anesthesia Procedure Notes (Signed)
Procedure Name: Intubation Date/Time: 12/11/2018 11:10 AM Performed by: Clearnce Sorrel, CRNA Pre-anesthesia Checklist: Patient identified, Emergency Drugs available, Suction available, Patient being monitored and Timeout performed Patient Re-evaluated:Patient Re-evaluated prior to induction Oxygen Delivery Method: Circle system utilized Preoxygenation: Pre-oxygenation with 100% oxygen Induction Type: IV induction Ventilation: Mask ventilation without difficulty Laryngoscope Size: Mac and 4 Grade View: Grade I Tube type: Oral Tube size: 7.5 mm Number of attempts: 1 Airway Equipment and Method: Stylet Placement Confirmation: ETT inserted through vocal cords under direct vision,  positive ETCO2 and breath sounds checked- equal and bilateral Secured at: 23 cm Tube secured with: Tape Dental Injury: Teeth and Oropharynx as per pre-operative assessment
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
## 2018-12-11 NOTE — Transfer of Care (Signed)
Immediate Anesthesia Transfer of Care Note  Patient: Allen Morrison  Procedure(s) Performed: OPEN REDUCTION INTERNAL FIXATION (ORIF) CLAVICULAR FRACTURE (Right )  Patient Location: PACU  Anesthesia Type:General  Level of Consciousness: sedated  Airway & Oxygen Therapy: Patient Spontanous Breathing and Patient connected to face mask oxygen  Post-op Assessment: Report given to RN and Post -op Vital signs reviewed and stable  Post vital signs: Reviewed and stable  Last Vitals:  Vitals Value Taken Time  BP 135/72 12/11/2018  1:24 PM  Temp 37.1 C 12/11/2018  1:18 PM  Pulse 99 12/11/2018  1:30 PM  Resp 26 12/11/2018  1:30 PM  SpO2 94 % 12/11/2018  1:30 PM  Vitals shown include unvalidated device data.  Last Pain:  Vitals:   12/11/18 1318  TempSrc:   PainSc: Asleep         Complications: No apparent anesthesia complications
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
## 2018-12-11 NOTE — Plan of Care (Signed)

## 2018-12-11 NOTE — ED Notes (Signed)
If pt can, have him call fiance, [place], [phone]
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
## 2018-12-11 NOTE — ED Notes (Addendum)
PT belongings given to family members with patient's verbal consent. Family provided phone to patient, phone provided by family to patient is patient's dad's cell phone. Patient's mom left her number for contact due to patient having the dad's phone. This tech told patient about the phone provided, placed it at bedside, and the patient verbally understood.    Juliann Pulse (patient's mom) [phone]

## 2018-12-11 NOTE — ED Notes (Signed)
Patient transported to CT 

## 2018-12-11 NOTE — ED Notes (Signed)
Allen Morrison for update [phone]
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
## 2018-12-12 ENCOUNTER — Encounter (HOSPITAL_COMMUNITY): Payer: Self-pay | Admitting: General Practice

## 2018-12-12 ENCOUNTER — Other Ambulatory Visit: Payer: Self-pay

## 2018-12-12 ENCOUNTER — Inpatient Hospital Stay (HOSPITAL_COMMUNITY): Payer: BC Managed Care – PPO

## 2018-12-12 DIAGNOSIS — S42111A Displaced fracture of body of scapula, right shoulder, initial encounter for closed fracture: Secondary | ICD-10-CM

## 2018-12-12 DIAGNOSIS — S2249XA Multiple fractures of ribs, unspecified side, initial encounter for closed fracture: Secondary | ICD-10-CM

## 2018-12-12 DIAGNOSIS — S27329A Contusion of lung, unspecified, initial encounter: Secondary | ICD-10-CM

## 2018-12-12 DIAGNOSIS — S02119A Unspecified fracture of occiput, initial encounter for closed fracture: Secondary | ICD-10-CM

## 2018-12-12 LAB — NOVEL CORONAVIRUS, NAA (HOSP ORDER, SEND-OUT TO REF LAB; TAT 18-24 HRS): SARS-CoV-2, NAA: NOT DETECTED

## 2018-12-12 LAB — BASIC METABOLIC PANEL
Anion gap: 8 (ref 5–15)
BUN: 15 mg/dL (ref 6–20)
CO2: 25 mmol/L (ref 22–32)
Calcium: 8.4 mg/dL — ABNORMAL LOW (ref 8.9–10.3)
Chloride: 106 mmol/L (ref 98–111)
Creatinine, Ser: 1.2 mg/dL (ref 0.61–1.24)
GFR calc Af Amer: >60 mL/min (ref >60)
GFR calc non Af Amer: >60 mL/min (ref >60)
Glucose, Bld: 114 mg/dL — ABNORMAL HIGH (ref 70–99)
Potassium: 4.2 mmol/L (ref 3.5–5.1)
Sodium: 139 mmol/L (ref 135–145)

## 2018-12-12 LAB — CBC
HCT: 35.3 % — ABNORMAL LOW (ref 39.0–52.0)
Hemoglobin: 12.1 g/dL — ABNORMAL LOW (ref 13.0–17.0)
MCH: 29.2 pg (ref 26.0–34.0)
MCHC: 34.3 g/dL (ref 30.0–36.0)
MCV: 85.1 fL (ref 80.0–100.0)
Platelets: 130 10*3/uL — ABNORMAL LOW (ref 150–400)
RBC: 4.15 MIL/uL — ABNORMAL LOW (ref 4.22–5.81)
RDW: 12.2 % (ref 11.5–15.5)
WBC: 12.3 10*3/uL — ABNORMAL HIGH (ref 4.0–10.5)
nRBC: 0 % (ref 0.0–0.2)

## 2018-12-12 NOTE — Anesthesia Postprocedure Evaluation (Signed)
Anesthesia Post Note  Patient: Allen Morrison  Procedure(s) Performed: OPEN REDUCTION INTERNAL FIXATION (ORIF) CLAVICULAR FRACTURE (Right )     Patient location during evaluation: PACU Anesthesia Type: General Level of consciousness: awake and alert Pain management: pain level controlled Vital Signs Assessment: post-procedure vital signs reviewed and stable Respiratory status: spontaneous breathing, nonlabored ventilation, respiratory function stable and patient connected to nasal cannula oxygen Cardiovascular status: blood pressure returned to baseline and stable Postop Assessment: no apparent nausea or vomiting Anesthetic complications: no    Last Vitals:  Vitals:   12/12/18 0019 12/12/18 0357  BP:    Pulse:    Resp:    Temp: 37.1 C 36.9 C  SpO2:      Last Pain:  Vitals:   12/12/18 0357  TempSrc: Oral  PainSc:    Pain Goal: Patients Stated Pain Goal: 2 (12/11/18 2307)                 Zackarey Holleman L Mong Neal

## 2018-12-12 NOTE — Plan of Care (Signed)
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
## 2018-12-12 NOTE — Progress Notes (Signed)
Date and time results received: 12/12/18 0032  Test:CT pf head  Critical Value:  not critical but new findings as reported by radiology dept; new bifrontal and bitemporal contusions as well as new, very small SDH at anterior faults.  Name of Provider Notified:  Ferne Reus  Orders Received? Or Actions Taken?: Actions Taken: continue to observe for any neuro changes.
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
## 2018-12-13 LAB — BASIC METABOLIC PANEL
Anion gap: 7 (ref 5–15)
BUN: 12 mg/dL (ref 6–20)
CO2: 25 mmol/L (ref 22–32)
Calcium: 8.2 mg/dL — ABNORMAL LOW (ref 8.9–10.3)
Chloride: 106 mmol/L (ref 98–111)
Creatinine, Ser: 1.1 mg/dL (ref 0.61–1.24)
GFR calc Af Amer: >60 mL/min (ref >60)
GFR calc non Af Amer: >60 mL/min (ref >60)
Glucose, Bld: 95 mg/dL (ref 70–99)
Potassium: 4.1 mmol/L (ref 3.5–5.1)
Sodium: 138 mmol/L (ref 135–145)
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
## 2018-12-13 NOTE — Discharge Instructions (Signed)
[facility] Discharge Instructions   General Discharge Instructions  WEIGHT BEARING STATUS: Non-weightbearing on right arm  RANGE OF MOTION/ACTIVITY: Okay for unrestricted range of motion of the right shoulder and elbow  Wound Care: Can leave incision open to air  DVT/PE prophylaxis: None needed  Diet: as you were eating previously.  Can use over the counter stool softeners and bowel preparations, such as Miralax, to help with bowel movements.  Narcotics can be constipating.  Be sure to drink plenty of fluids  PAIN MEDICATION USE AND EXPECTATIONS  You have likely been given narcotic medications to help control your pain.  After a traumatic event that results in an fracture (broken bone) with or without surgery, it is ok to use narcotic pain medications to help control one's pain.  We understand that everyone responds to pain differently and each individual patient will be evaluated on a regular basis for the continued need for narcotic medications. Ideally, narcotic medication use should last no more than 6-8 weeks (coinciding with fracture healing).   As a patient it is your responsibility as well to monitor narcotic medication use and report the amount and frequency you use these medications when you come to your office visit.   We would also advise that if you are using narcotic medications, you should take a dose prior to therapy to maximize you participation.  IF YOU ARE ON NARCOTIC MEDICATIONS IT IS NOT PERMISSIBLE TO OPERATE A MOTOR VEHICLE (MOTORCYCLE/CAR/TRUCK/MOPED) OR HEAVY MACHINERY DO NOT MIX NARCOTICS WITH OTHER CNS (CENTRAL NERVOUS SYSTEM) DEPRESSANTS SUCH AS ALCOHOL   STOP SMOKING OR USING NICOTINE PRODUCTS!!!!  As discussed nicotine severely impairs your body's ability to heal surgical and traumatic wounds but also impairs bone healing.  Wounds and bone heal by forming microscopic blood vessels (angiogenesis) and nicotine is a vasoconstrictor (essentially,  shrinks blood vessels).  Therefore, if vasoconstriction occurs to these microscopic blood vessels they essentially disappear and are unable to deliver necessary nutrients to the healing tissue.  This is one modifiable factor that you can do to dramatically increase your chances of healing your injury.    (This means no smoking, no nicotine gum, patches, etc)  DO NOT USE NONSTEROIDAL ANTI-INFLAMMATORY DRUGS (NSAID'S)  Using products such as Advil (ibuprofen), Aleve (naproxen), Motrin (ibuprofen) for additional pain control during fracture healing can delay and/or prevent the healing response.  If you would like to take over the counter (OTC) medication, Tylenol (acetaminophen) is ok.  However, some narcotic medications that are given for pain control contain acetaminophen as well. Therefore, you should not exceed more than 4000 mg of tylenol in a day if you do not have liver disease.  Also note that there are may OTC medicines, such as cold medicines and allergy medicines that my contain tylenol as well.  If you have any questions about medications and/or interactions please ask your doctor/PA or your pharmacist.      ICE AND ELEVATE INJURED/OPERATIVE EXTREMITY  Using ice and elevating the injured extremity above your heart can help with swelling and pain control.  Icing in a pulsatile fashion, such as 20 minutes on and 20 minutes off, can be followed.    Do not place ice directly on skin. Make sure there is a barrier between to skin and the ice pack.    Using frozen items such as frozen peas works well as the conform nicely to the are that needs to be iced.  USE AN ACE WRAP OR TED HOSE FOR  SWELLING CONTROL  In addition to icing and elevation, Ace wraps or TED hose are used to help limit and resolve swelling.  It is recommended to use Ace wraps or TED hose until you are informed to stop.    When using Ace Wraps start the wrapping distally (farthest away from the body) and wrap proximally (closer to the  body)   Example: If you had surgery on your leg or thing and you do not have a splint on, start the ace wrap at the toes and work your way up to the thigh        If you had surgery on your upper extremity and do not have a splint on, start the ace wrap at your fingers and work your way up to the upper arm   [place]: [phone]   VISIT OUR WEBSITE FOR ADDITIONAL INFORMATION: [email or web address]     Skull Fracture, Adult A skull fracture is a break or crack in one of the bones that make up the skull. Skull fractures range in severity. They are usually more serious if:  They happen with an injury to the brain, spine, nerves, or blood vessels.  The broken or cracked bone has moved out of place. Bones that have moved can push into the brain or poke at what is near them.  The broken or cracked bone is at the back or bottom (base) of the skull. What are the causes? This condition is usually caused by a forceful injury to the head. This can happen from:  A fall from a high place.  An assault.  A hard, direct hit (blow) to the head.  A car crash. What are the signs or symptoms? Symptoms of a skull fracture include:  A headache.  Sensitivity to noise and light.  Clear or bloody liquid leaking from the nose or ears.  Blurred vision or double vision.  Slurred speech.  Nausea or vomiting.  Bruising around the eyes or behind the ears.  Nerve weakness in the face.  Difficulty with hearing or smelling.  Confusion.  Weakness or numbness in one side or one area of the body. How is this diagnosed? This condition is diagnosed with a physical exam and imaging tests, such as:  X-rays.  CT scan.  MRI. How is this treated? Most skull fractures heal without treatment. Others require surgery to correct the position of any bones that have moved. Surgery may also be required to correct injuries to other areas of the head or spine. Medicine may  be given for symptoms like headaches or nausea. Follow these instructions at home:  Do not drink alcoholic beverages until your health care provider says it is okay.  Take over-the-counter and prescription medicines only as told by your health care provider.  Keep all follow-up visits as told by your health care provider. This is important. Contact a health care provider if:  You feel confused.  You have ongoing (persistent) headaches that are not relieved by medicines.  Your symptoms do not go away as expected. Get help right away if:  You feel nauseous.  You keep vomiting.  You have convulsions or seizures.  You are drowsy or have problems waking up.  You lose consciousness.  Your eyes move back and forth rapidly (nystagmus).  You suddenly develop a severe headache.  You have a headache and it suddenly worsens.  Your arms or legs do not move the way they should.  The pupil of  your eye changes size much more than it normally does.  You have clear or bloody liquid coming from your nose or ears. This information is not intended to replace advice given to you by your health care provider. Make sure you discuss any questions you have with your health care provider. Document Released: 04/19/2004 Document Revised: 02/13/2016 Document Reviewed: 12/11/2014 Elsevier Interactive Patient Education  2019 [employer]    Rib Fracture  A rib fracture is a break or crack in one of the bones of the ribs. The ribs are like a cage that goes around your upper chest. A broken or cracked rib is often painful, but most do not cause other problems. Most rib fractures usually heal on their own in 1-3 months. Follow these instructions at home: Managing pain, stiffness, and swelling  If directed, apply ice to the injured area. ? Put ice in a plastic bag. ? Place a towel between your skin and the bag. ? Leave the ice on for 20 minutes, 2-3 times a day.  Take over-the-counter and  prescription medicines only as told by your doctor. Activity  Avoid activities that cause pain to the injured area. Protect your injured area.  Slowly increase activity as told by your doctor. General instructions  Do deep breathing as told by your doctor. You may be told to: ? Take deep breaths many times a day. ? Cough many times a day while hugging a pillow. ? Use a device (incentive spirometer) to do deep breathing many times a day.  Drink enough fluid to keep your pee (urine) clear or pale yellow.  Do not wear a rib belt or binder. These do not allow you to breathe deeply.  Keep all follow-up visits as told by your doctor. This is important. Contact a doctor if:  You have a fever. Get help right away if:  You have trouble breathing.  You are short of breath.  You cannot stop coughing.  You cough up thick or bloody spit (sputum).  You feel sick to your stomach (nauseous), throw up (vomit), or have belly (abdominal) pain.  Your pain gets worse and medicine does not help. Summary  A rib fracture is a break or crack in one of the bones of the ribs.  Apply ice to the injured area and take medicines for pain as told by your doctor.  Take deep breaths and cough many times a day. Hug a pillow every time you cough. This information is not intended to replace advice given to you by your health care provider. Make sure you discuss any questions you have with your health care provider. Document Released: 03/28/2008 Document Revised: 09/19/2016 Document Reviewed: 09/19/2016 Elsevier Interactive Patient Education  2019 [employer] Inc[employer]   Pneumothorax A pneumothorax is commonly called a collapsed lung. It is a condition in which air leaks from a lung and builds up between the thin layer of tissue that covers the lungs (visceral pleura) and the interior wall of the chest cavity (parietal pleura). The air gets trapped outside the lung, between the lung and the chest wall (pleural  space). The air takes up space and prevents the lung from fully expanding. This condition sometimes occurs suddenly with no apparent cause. The buildup of air may be small or large. A small pneumothorax may go away on its own. A large pneumothorax will require treatment and hospitalization. What are the causes? This condition may be caused by:  Trauma and injury to the chest wall.  Surgery and  other medical procedures.  A complication of an underlying lung problem, especially chronic obstructive pulmonary disease (COPD) or emphysema. Sometimes the cause of this condition is not known. What increases the risk? You are more likely to develop this condition if:  You have an underlying lung problem.  You smoke.  You are 66-65 years old, male, tall, and underweight.  You have a personal or family history of pneumothorax.  You have an eating disorder (anorexia nervosa). This condition can also happen quickly, even in people with no history of lung problems. What are the signs or symptoms? Sometimes a pneumothorax will have no symptoms. When symptoms are present, they can include:  Chest pain.  Shortness of breath.  Increased rate of breathing.  Bluish color to your lips or skin (cyanosis). How is this diagnosed? This condition may be diagnosed by:  A medical history and physical exam.  A chest X-ray, chest CT scan, or ultrasound. How is this treated? Treatment depends on how severe your condition is. The goal of treatment is to remove the extra air and allow your lung to expand back to its normal size.  For a small pneumothorax: ? No treatment may be needed. ? Extra oxygen is sometimes used to make it go away more quickly.  For a large pneumothorax or a pneumothorax that is causing symptoms, a procedure is done to drain the air from your lungs. To do this, a health care provider may use: ? A needle with a syringe. This is used to suck air from a pleural space where no  additional leakage is taking place. ? A chest tube. This is used to suck air where there is ongoing leakage into the pleural space. The chest tube may need to remain in place for several days until the air leak has healed.  In more severe cases, surgery may be needed to repair the damage that is causing the leak.  If you have multiple pneumothorax episodes or have an air leak that will not heal, a procedure called a pleurodesis may be done. A medicine is placed in the pleural space to irritate the tissues around the lung so that the lung will stick to the chest wall, seal any leaks, and stop any buildup of air in that space. If you have an underlying lung problem, severe symptoms, or a large pneumothorax you will usually need to stay in the hospital. Follow these instructions at home: Lifestyle  Do not use any products that contain nicotine or tobacco, such as cigarettes and e-cigarettes. These are major risk factors in pneumothorax. If you need help quitting, ask your health care provider.  Do not lift anything that is heavier than 10 lb (4.5 kg), or the limit that your health care provider tells you, until he or she says that it is safe.  Avoid activities that take a lot of effort (strenuous) for as long as told by your health care provider.  Return to your normal activities as told by your health care provider. Ask your health care provider what activities are safe for you.  Do not fly in an airplane or scuba dive until your health care provider says it is okay. General instructions  Take over-the-counter and prescription medicines only as told by your health care provider.  If a cough or pain makes it difficult for you to sleep at night, try sleeping in a semi-upright position in a recliner or by using 2 or 3 pillows.  If you had a chest tube  and it was removed, ask your health care provider when you can remove the bandage (dressing). While the dressing is in place, do not allow it to  get wet.  Keep all follow-up visits as told by your health care provider. This is important. Contact a health care provider if:  You cough up thick mucus (sputum) that is yellow or green in color.  You were treated with a chest tube, and you have redness, increasing pain, or discharge at the site where it was placed. Get help right away if:  You have increasing chest pain or shortness of breath.  You have a cough that will not go away.  You begin coughing up blood.  You have pain that is getting worse or is not controlled with medicines.  The site where your chest tube was located opens up.  You feel air coming out of the site where the chest tube was placed.  You have a fever or persistent symptoms for more than 2-3 days.  You have a fever and your symptoms suddenly get worse. These symptoms may represent a serious problem that is an emergency. Do not wait to see if the symptoms will go away. Get medical help right away. Call your local emergency services (911 in the [place] Do not drive yourself to the hospital. Summary  A pneumothorax, commonly called a collapsed lung, is a condition in which air leaks from a lung and gets trapped between the lung and the chest wall (pleural space).  The buildup of air may be small or large. A small pneumothorax may go away on its own. A large pneumothorax will require treatment and hospitalization.  Treatment for this condition depends on how severe the pneumothorax is. The goal of treatment is to remove the extra air and allow the lung to expand back to its normal size. This information is not intended to replace advice given to you by your health care provider. Make sure you discuss any questions you have with your health care provider. Document Released: 06/19/2005 Document Revised: 05/28/2017 Document Reviewed: 05/28/2017 Elsevier Interactive Patient Education  2019 [employer]

## 2018-12-13 NOTE — Progress Notes (Signed)
Occupational Therapy Treatment Patient Details Name: Allen Morrison MRN: [id number] DOB: September 21, 1985 Today's Date: 12/13/2018    History of present illness Pt is a 33 y/[facility] multiple R rib fractures, pulmonary contusion, small pneumothorax, small hemoperitoneum, fractures of the right scapula and right clavicle, and a small nondisplaced linear right occipital skull fracture; CT head shows small area of hemorrhagic cerebral contusion without mass-effect or shift. Pt is now s/p ORIF of R scapula/clavicle fx on 6/10.    OT comments  Pt progressing towards OT goals, but continues to have limitations due to R hip pain with mobility. Pt only able to tolerate mobility a short distance in room with minA, cues for safety. He continues to require maxA for sling management. Pt tolerating RUE ROM seated in recliner, AROM to e/w/h and P/AAROM to R shoulder within pt tolerance. Pt with notable STM deficits this session, but does have some insight into deficits and asking appropriate questions including questions about recovery timeline. Feel POC remains appropriate at this time. Will continue to follow acutely.   Follow Up Recommendations  Home health OT;Supervision/Assistance - 24 hour    Equipment Recommendations  3 in 1 bedside commode          Precautions / Restrictions Precautions Precautions: Fall Precaution Comments: Right scapula/clavicle fx, per ortho note NWB, no ROM restrictions Required Braces or Orthoses: Sling(for comfort) Restrictions Weight Bearing Restrictions: Yes RUE Weight Bearing: Non weight bearing       Mobility Bed Mobility Overal bed mobility: Needs Assistance Bed Mobility: Sit to Supine     Supine to sit: Min guard Sit to supine: Min guard   General bed mobility comments: minguard for safety; increased time/effort to get LEs onto EOB but no assist required  Transfers Overall transfer level: Needs assistance Equipment used:  None Transfers: Sit to/from Stand Sit to Stand: Min assist         General transfer comment: boosting assist from recliner    Balance Overall balance assessment: Needs assistance Sitting-balance support: Feet supported Sitting balance-Leahy Scale: Good     Standing balance support: No upper extremity supported;During functional activity Standing balance-Leahy Scale: Fair                             ADL either performed or assessed with clinical judgement   ADL Overall ADL's : Needs assistance/impaired                 Upper Body Dressing : Maximal assistance;Sitting Upper Body Dressing Details (indicate cue type and reason):  for sling management         Toileting- Clothing Manipulation and Hygiene: Supervision/safety;Sitting/lateral lean Toileting - Clothing Manipulation Details (indicate cue type and reason): pt using urinal with distant supervision start of session     Functional mobility during ADLs: Minimal assistance       Vision       Perception     Praxis      Cognition Arousal/Alertness: Awake/alert Behavior During Therapy: Flat affect Overall Cognitive Status: Impaired/Different from baseline Area of Impairment: Memory;Awareness;Safety/judgement                     Memory: Decreased short-term memory   Safety/Judgement: Decreased awareness of deficits Awareness: Intellectual   General Comments: pt did not recall working with PT earlier today when asked about getting up to chair; pt does ask appropriate questions related to his current deficits  Exercises Exercises: General Upper Extremity;Hand exercises;Other exercises General Exercises - Upper Extremity Shoulder Flexion: PROM;AAROM;Right;5 reps(grossly to 45*) Shoulder ABduction: PROM;AAROM;Right;10 reps(grossly to 30*) Shoulder ADduction: PROM;AAROM;Right;10 reps;Seated Elbow Flexion: AROM;Right;10 reps Elbow Extension: AROM;Right;10 reps Wrist Flexion:  AROM;10 reps;Right Wrist Extension: AROM;Right;10 reps Digit Composite Flexion: AROM;Right;10 reps Composite Extension: AROM;Right;10 reps General Exercises - Lower Extremity Long Arc Quad: 10 reps;Both;Seated Heel Slides: 10 reps;Both;Supine Straight Leg Raises: 10 reps;Both;Supine Hand Exercises Forearm Supination: AROM;Right;10 reps Forearm Pronation: AROM;Right;10 reps Other Exercises Other Exercises: shoulder internal/external rotation grossly to neutral; A/AAROM RUE x10   Shoulder Instructions       General Comments      Pertinent Vitals/ Pain       Pain Assessment: Faces Faces Pain Scale: Hurts even more Pain Location: R hip with wt bearing; RUE with some ROM Pain Descriptors / Indicators: Discomfort;Grimacing;Guarding;Sore Pain Intervention(s): Monitored during session;Limited activity within patient's tolerance;Repositioned  Home Living                                          Prior Functioning/Environment              Frequency  Min 3X/week        Progress Toward Goals  OT Goals(current goals can now be found in the care plan section)  Progress towards OT goals: Progressing toward goals  Acute Rehab OT Goals Patient Stated Goal: "get back to normal" OT Goal Formulation: With patient Time For Goal Achievement: 12/26/18 Potential to Achieve Goals: Good  Plan Discharge plan remains appropriate    Co-evaluation                 AM-PAC OT "6 Clicks" Daily Activity     Outcome Measure   Help from another person eating meals?: A Little Help from another person taking care of personal grooming?: A Little Help from another person toileting, which includes using toliet, bedpan, or urinal?: A Lot Help from another person bathing (including washing, rinsing, drying)?: A Lot Help from another person to put on and taking off regular upper body clothing?: A Lot Help from another person to put on and taking off regular lower body  clothing?: A Lot 6 Click Score: 14    End of Session Equipment Utilized During Treatment: Gait belt;Other (comment)(sling)  OT Visit Diagnosis: Other abnormalities of gait and mobility (R26.89);Pain Pain - Right/Left: Right Pain - part of body: Shoulder;Hip   Activity Tolerance Patient tolerated treatment well   Patient Left in bed;with call bell/phone within reach;with bed alarm set   Nurse Communication Mobility status        Time: 7829-5621 OT Time Calculation (min): 31 min  Charges: OT General Charges $OT Visit: 1 Visit OT Treatments $Self Care/Home Management : 8-22 mins $Therapeutic Activity: 8-22 mins  Lou Cal, OT [facility] Pager [phone] Office [phone]    Raymondo Band 12/13/2018, 4:32 PM

## 2018-12-13 NOTE — Progress Notes (Signed)
Vitals:   12/12/18 2000 12/12/18 2326 12/13/18 0400 12/13/18 0735  BP:  114/74 122/76 127/79  Pulse: 76   89  Resp: (!) 21   (!) 26  Temp: 99 F (37.2 C) 99 F (37.2 C) 99.1 F (37.3 C) 99.2 F (37.3 C)  TempSrc: Oral Oral Oral Oral  SpO2: 94%   95%  Weight:      Height:        CBC Recent Labs    12/11/18 0255 12/12/18 0440  WBC 14.3* 12.3*  HGB 14.5 12.1*  HCT 42.3 35.3*  PLT 152 130*   BMET Recent Labs    12/12/18 0440 12/13/18 0247  NA 139 138  K 4.2 4.1  CL 106 106  CO2 25 25  GLUCOSE 114* 95  BUN 15 12  CREATININE 1.20 1.10  CALCIUM 8.4* 8.2*    Patient resting comfortably in bed.  Neurologically stable.  He remains awake and alert, oriented.  Moving all 4 extremities well, although exam of proximal right upper extremity limited by local injuries.  Plan: We will check follow-up CT of the brain without contrast on 6/15, if still in hospital.  Hosie Spangle, MD 12/13/2018, 7:59 AM

## 2018-12-13 NOTE — Progress Notes (Addendum)
Physical Therapy Treatment Patient Details Name: Allen Morrison MRN: [id number] DOB: 03-29-1986 Today's Date: 12/13/2018    History of Present Illness Pt is a 33 y/o male presents after [facility], sustained multiple R rib fractures, pulmonary contusion, small pneumothorax, small hemoperitoneum, fractures of the right scapula and right clavicle, and a small nondisplaced linear right occipital skull fracture; CT head shows small area of hemorrhagic cerebral contusion without mass-effect or shift. Pt is now s/p ORIF of R scapula/clavicle fx on 6/10.     PT Comments    Pt able to ambulate x 15 feet this session, but remains limited by right hip pain with weightbearing. Right hip ROM normal, continues with increased edema noted around right PSIS. Ice provided at end of session. Pt also with noted decreased short term recall, flat affect. Will trial left crutch next session to attempt to offload right side during midstance.     Follow Up Recommendations  Home health PT;Supervision for mobility/OOB (will need progression to OPPT)     Equipment Recommendations  3in1 (PT);Crutches    Recommendations for Other Services       Precautions / Restrictions Precautions Precautions: Fall Precaution Comments: Right scapula/clavicle fx, per ortho note NWB, no ROM restrictions Required Braces or Orthoses: Sling(for comfort) Restrictions Weight Bearing Restrictions: Yes RUE Weight Bearing: Non weight bearing    Mobility  Bed Mobility Overal bed mobility: Needs Assistance Bed Mobility: Supine to Sit     Supine to sit: Min guard     General bed mobility comments: Able to progress to edge of bed without physical assist. Increased time/effort  Transfers Overall transfer level: Needs assistance Equipment used: None Transfers: Sit to/from Stand Sit to Stand: Min assist         General transfer comment: boosting assist from elevated EOB  Ambulation/Gait Ambulation/Gait assistance: Min guard Gait  Distance (Feet): 15 Feet Assistive device: None Gait Pattern/deviations: Step-to pattern;Antalgic;Decreased weight shift to right;Wide base of support Gait velocity: decreased Gait velocity interpretation: <1.8 ft/sec, indicate of risk for recurrent falls General Gait Details: No physical assist required, min guard for safety. Very antalgic gait pattern, right hip pain with weightbearing   Stairs             Wheelchair Mobility    Modified Rankin (Stroke Patients Only)       Balance Overall balance assessment: Needs assistance Sitting-balance support: Feet supported Sitting balance-Leahy Scale: Good     Standing balance support: No upper extremity supported;During functional activity Standing balance-Leahy Scale: Fair                              Cognition Arousal/Alertness: Awake/alert Behavior During Therapy: Flat affect Overall Cognitive Status: Impaired/Different from baseline Area of Impairment: Memory;Awareness;Safety/judgement                     Memory: Decreased short-term memory   Safety/Judgement: Decreased awareness of deficits Awareness: Intellectual   General Comments: Pt does not recall therapist being in room ~1 hour prior, telling PA he has not had physical therapy yet       Exercises General Exercises - Lower Extremity Long Arc Quad: 10 reps;Both;Seated Heel Slides: 10 reps;Both;Supine Straight Leg Raises: 10 reps;Both;Supine    General Comments        Pertinent Vitals/Pain Pain Assessment: Faces Faces Pain Scale: Hurts even more Pain Location: R hip with wt bearing Pain Descriptors / Indicators: Discomfort;Grimacing;Guarding;Sore Pain Intervention(s): Monitored during session  Home Living                      Prior Function            PT Goals (current goals can now be found in the care plan section) Acute Rehab PT Goals Patient Stated Goal: less pain PT Goal Formulation: With patient Time For  Goal Achievement: 12/26/18 Potential to Achieve Goals: Good Progress towards PT goals: Progressing toward goals    Frequency    Min 5X/week      PT Plan Equipment recommendations need to be updated    Co-evaluation              AM-PAC PT "6 Clicks" Mobility   Outcome Measure  Help needed turning from your back to your side while in a flat bed without using bedrails?: None Help needed moving from lying on your back to sitting on the side of a flat bed without using bedrails?: A Little Help needed moving to and from a bed to a chair (including a wheelchair)?: A Little Help needed standing up from a chair using your arms (e.g., wheelchair or bedside chair)?: A Little Help needed to walk in hospital room?: A Lot Help needed climbing 3-5 steps with a railing? : A Lot 6 Click Score: 17    End of Session Equipment Utilized During Treatment: Other (comment)(sling) Activity Tolerance: Patient limited by pain Patient left: with call bell/phone within reach;in chair Nurse Communication: Mobility status PT Visit Diagnosis: Pain;Difficulty in walking, not elsewhere classified (R26.2) Pain - Right/Left: Right Pain - part of body: Hip     Time: [id number] PT Time Calculation (min) (ACUTE ONLY): 18 min  Charges:  $Therapeutic Activity: 8-22 mins                     Laurina Bustlearoline Jakyla Reza, PT, DPT Acute Rehabilitation Services Pager [phone] Office [phone]    Vanetta MuldersCarloine H Charlies Rayburn 12/13/2018, 4:10 PM
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
## 2018-12-14 MED ORDER — OXYCODONE HCL 5 MG PO TABS: 5.0000 mg | ORAL_TABLET | ORAL | 0 refills | Status: DC | PRN

## 2018-12-14 MED ORDER — OXYCODONE HCL 5 MG PO TABS: 5.0000 mg | ORAL_TABLET | Freq: Four times a day (QID) | ORAL | 0 refills | Status: DC | PRN

## 2018-12-14 MED ORDER — METHOCARBAMOL 750 MG PO TABS: 750.0000 mg | ORAL_TABLET | Freq: Three times a day (TID) | ORAL | 0 refills | Status: AC

## 2018-12-14 MED ORDER — GABAPENTIN 100 MG PO CAPS: 100.0000 mg | ORAL_CAPSULE | Freq: Three times a day (TID) | ORAL | 0 refills | Status: AC

## 2018-12-14 MED ORDER — ACETAMINOPHEN 500 MG PO TABS: 1000.0000 mg | ORAL_TABLET | Freq: Three times a day (TID) | ORAL | Status: AC | PRN

## 2018-12-14 NOTE — Progress Notes (Signed)
Occupational Therapy Treatment Patient Details Name: Allen SpruceKevin Morrison MRN: [id number] DOB: [birth year] Today's Date: 12/14/2018    History of present illness Pt is a 33 y/[facility] multiple R rib fractures, pulmonary contusion, small pneumothorax, small hemoperitoneum, fractures of the right scapula and right clavicle, and a small nondisplaced linear right occipital skull fracture; CT head shows small area of hemorrhagic cerebral contusion without mass-effect or shift. Pt is now s/p ORIF of R scapula/clavicle fx on 6/10.    OT comments  Pt demonstrates improved cognition.  He demonstrates behaviors consistent with at least [place].  He is fully oriented, and demonstrates emergent awareness.  He requires cues for memory.  He is able to perform ADLs at supervision - min guard assist level and cues for problem solving.  Reviewed self ROM and sling management.   Follow Up Recommendations  Home health OT;Supervision/Assistance - 24 hour    Equipment Recommendations  3 in 1 bedside commode    Recommendations for Other Services      Precautions / Restrictions Precautions Precautions: Fall Precaution Comments: Right scapula/clavicle fx, per ortho note NWB, no ROM restrictions Required Braces or Orthoses: Sling(for comfort) Restrictions Weight Bearing Restrictions: Yes RUE Weight Bearing: Non weight bearing       Mobility Bed Mobility               General bed mobility comments: up in chair   Transfers Overall transfer level: Needs assistance Equipment used: Crutches Transfers: Sit to/from UGI CorporationStand;Stand Pivot Transfers Sit to Stand: Min guard Stand pivot transfers: Min guard            Balance Overall balance assessment: Needs assistance Sitting-balance support: Feet supported Sitting balance-Leahy Scale: Good     Standing balance support: No upper extremity supported;During functional activity Standing balance-Leahy Scale: Fair                              ADL either performed or assessed with clinical judgement   ADL Overall ADL's : Needs assistance/impaired                 Upper Body Dressing : Supervision/safety;Sitting Upper Body Dressing Details (indicate cue type and reason): verbal cues provided  Lower Body Dressing: Min guard;Sit to/from stand Lower Body Dressing Details (indicate cue type and reason): able to don/doff socks  Toilet Transfer: Min guard;Ambulation;Comfort height toilet   Toileting- Clothing Manipulation and Hygiene: Min guard;Sit to/from stand   Tub/ Shower Transfer: Tub transfer;Min guard;Ambulation;3 in 1 Tub/Shower Transfer Details (indicate cue type and reason): Pt performed simulated shower transfer  Functional mobility during ADLs: Scientist, research (medical)Min guard(crutch )       Vision       Perception     Praxis      Cognition Arousal/Alertness: Awake/alert Behavior During Therapy: Flat affect Overall Cognitive Status: Impaired/Different from baseline Area of Impairment: Attention;Memory;Awareness;Problem solving                   Current Attention Level: Alternating(with cues ) Memory: Decreased short-term memory;Decreased recall of precautions   Safety/Judgement: Decreased awareness of deficits Awareness: Emergent Problem Solving: Slow processing;Requires verbal cues;Requires tactile cues          Exercises Exercises: General Upper Extremity;Hand exercises;Other exercises General Exercises - Upper Extremity Shoulder Flexion: AAROM;Right;5 reps Shoulder ABduction: AAROM;Right;5 reps Elbow Flexion: AROM;Right;10 reps Elbow Extension: AROM;Right;10 reps Wrist Flexion: AROM;10 reps;Right Wrist Extension: AROM;Right;10 reps Digit Composite Flexion:  AROM;Right;10 reps Composite Extension: AROM;Right;10 reps Hand Exercises Forearm Supination: AROM;Right;10 reps Forearm Pronation: AROM;Right;10 reps Other Exercises Other Exercises: self ROM shoulder external rotation   Other Exercises: reviewed how to don/doff sling and shoulder limitations    Shoulder Instructions       General Comments      Pertinent Vitals/ Pain       Pain Assessment: Faces Faces Pain Scale: Hurts little more Pain Location: R hip with wt bearing; RUE with some ROM Pain Descriptors / Indicators: Discomfort;Grimacing;Guarding;Sore Pain Intervention(s): Monitored during session  Home Living                                          Prior Functioning/Environment              Frequency  Min 3X/week        Progress Toward Goals  OT Goals(current goals can now be found in the care plan section)  Progress towards OT goals: Progressing toward goals     Plan Discharge plan remains appropriate    Co-evaluation                 AM-PAC OT "6 Clicks" Daily Activity     Outcome Measure   Help from another person eating meals?: A Little Help from another person taking care of personal grooming?: A Little Help from another person toileting, which includes using toliet, bedpan, or urinal?: A Little Help from another person bathing (including washing, rinsing, drying)?: A Little Help from another person to put on and taking off regular upper body clothing?: A Little Help from another person to put on and taking off regular lower body clothing?: A Little 6 Click Score: 18    End of Session Equipment Utilized During Treatment: Other (comment)(sling )  OT Visit Diagnosis: Other abnormalities of gait and mobility (R26.89);Pain Pain - Right/Left: Right Pain - part of body: Shoulder;Hip   Activity Tolerance Patient tolerated treatment well   Patient Left in chair;with call bell/phone within reach   Nurse Communication Mobility status        Time: 1610-9604 OT Time Calculation (min): 28 min  Charges: OT General Charges $OT Visit: 1 Visit OT Treatments $Self Care/Home Management : 8-22 mins $Therapeutic Exercise: 8-22 mins  Lucille Passy, OTR/L Acute Rehabilitation Services Pager [phone] Office [phone]    Lucille Passy M 12/14/2018, 11:46 AM

## 2018-12-14 NOTE — Progress Notes (Signed)
Orthopedic Tech Progress Note Patient Details:  Allen Morrison 1986/05/05 [id number]  Ortho Devices Type of Ortho Device: Crutches Ortho Device/Splint Interventions: Adjustment   Post Interventions Patient Tolerated: Well   [place] Pink 12/14/2018, 2:54 PM
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
## 2018-12-14 NOTE — Progress Notes (Signed)
RN gave crutches and printed prescription to Howell Rucks (phone number: [phone]) per pt's girlfriend, [name], request.

## 2018-12-14 NOTE — Progress Notes (Signed)
  NEUROSURGERY PROGRESS NOTE   No issues overnight.  Denies HA, dizziness  EXAM:  BP 128/78 (BP Location: Left Arm)   Pulse 65   Temp 97.9 F (36.6 C) (Oral)   Resp 17   Ht 5\' 9"  (1.753 m)   Wt 74.8 kg   SpO2 92%   BMI 24.37 kg/m   Awake, alert, oriented  Speech fluent, appropriate  CN grossly intact  5/5 BLE/LUE.  RUE: proximally unable to be assessed due to splint. Distal strength normal    PLAN Stable neurologically No new NS recs Stable for d/c when ok from trauma standpoint
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
## 2018-12-14 NOTE — Progress Notes (Signed)
Discharge instructions and medications list given to patient and explained to patient's mother [name] over the phone, given patient forgetfulness secondary to concussion. Questions answered and concerns addressed. Verbalized understanding. Orthopedic technician called and crutches have been delivered to the room. Patient discharged home with family in stable condition; alert and oriented at baseline at the time of transfer. [name], BSN, RN.

## 2018-12-16 ENCOUNTER — Other Ambulatory Visit: Payer: Self-pay | Admitting: Physician Assistant

## 2018-12-16 ENCOUNTER — Encounter (HOSPITAL_COMMUNITY): Payer: Self-pay | Admitting: Nurse Practitioner

## 2018-12-16 DIAGNOSIS — S069X0A Unspecified intracranial injury without loss of consciousness, initial encounter: Secondary | ICD-10-CM

## 2018-12-17 ENCOUNTER — Other Ambulatory Visit: Payer: Self-pay | Admitting: Neurosurgery

## 2018-12-17 ENCOUNTER — Ambulatory Visit
Admission: RE | Admit: 2018-12-17 | Discharge: 2018-12-17 | Disposition: A | Discharge status: Home / Self Care | Payer: BLUE CROSS/BLUE SHIELD | Source: Ambulatory Visit | Attending: Neurosurgery | Admitting: Neurosurgery

## 2018-12-17 DIAGNOSIS — S0291XB Unspecified fracture of skull, initial encounter for open fracture: Secondary | ICD-10-CM

## 2019-01-10 ENCOUNTER — Encounter: Payer: Self-pay | Admitting: Physical Medicine and Rehabilitation

## 2019-02-04 ENCOUNTER — Other Ambulatory Visit: Payer: Self-pay | Admitting: Neurosurgery

## 2019-02-04 DIAGNOSIS — S0291XB Unspecified fracture of skull, initial encounter for open fracture: Secondary | ICD-10-CM

## 2019-02-07 ENCOUNTER — Ambulatory Visit
Admission: RE | Admit: 2019-02-07 | Discharge: 2019-02-07 | Disposition: A | Discharge status: Home / Self Care | Payer: BC Managed Care – PPO | Source: Ambulatory Visit | Attending: Neurosurgery | Admitting: Neurosurgery

## 2019-02-07 ENCOUNTER — Other Ambulatory Visit: Payer: Self-pay

## 2019-02-07 DIAGNOSIS — S0291XB Unspecified fracture of skull, initial encounter for open fracture: Secondary | ICD-10-CM

## 2019-02-14 ENCOUNTER — Other Ambulatory Visit: Payer: Self-pay

## 2019-02-14 ENCOUNTER — Encounter: Payer: Self-pay | Admitting: Physical Medicine and Rehabilitation

## 2019-02-14 ENCOUNTER — Encounter
Payer: BC Managed Care – PPO | Attending: Physical Medicine and Rehabilitation | Admitting: Physical Medicine and Rehabilitation

## 2019-02-14 VITALS — BP 128/87 | HR 76 | Temp 98.2°F | Ht 73.0 in | Wt 212.0 lb

## 2019-02-14 DIAGNOSIS — S069X9S Unspecified intracranial injury with loss of consciousness of unspecified duration, sequela: Secondary | ICD-10-CM

## 2019-02-14 DIAGNOSIS — S02119S Unspecified fracture of occiput, sequela: Secondary | ICD-10-CM | POA: Diagnosis present

## 2019-02-14 DIAGNOSIS — S2241XS Multiple fractures of ribs, right side, sequela: Secondary | ICD-10-CM

## 2019-02-14 DIAGNOSIS — S069X9A Unspecified intracranial injury with loss of consciousness of unspecified duration, initial encounter: Secondary | ICD-10-CM | POA: Insufficient documentation
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
## 2019-02-14 NOTE — Patient Instructions (Signed)
  1. Previous SDH with mod TBI with likely full resolution - will write pt can return to work with no impairments/restrictions, however if he determines that fatigue or pain become an issue with full time, will reassess in future - mild increase in reflexes- discussed with pt about unlikely but possible chance of spasticity/muscle tightness- although the chance of it occurring is low, since bilateral, will have pt keep an eye out for it.  - Will write letter for pt to return to work - keep risk of irritability in mind - CoQ10- no significant research, but it's safe- some people use for brain recovery - if pt notices fogginess doesn't improve, Neuropsych testing can be done after 3 months 2. Rib pain s/p fractures- tylenol as needed- doesn't need strong pain meds; lidocaine patches over the counter can be worn for 12 hrs.- off for 12 hrs. If needs it.  3. Dispo- f/u if needed  4. Risk for 2nd brain injuries and sudden death increased for a total of 1 year from initial injury

## 2019-04-10 ENCOUNTER — Ambulatory Visit: Payer: BC Managed Care – PPO | Admitting: Psychology
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
